# Patient Record
Sex: Female | Born: 1995 | Race: White | Hispanic: No | Marital: Single | Smoking: Never smoker
Health system: Southern US, Community
[De-identification: ages and names within clinical notes are randomized; demographics above are authoritative.]

## PROBLEM LIST (undated history)

## (undated) DIAGNOSIS — F419 Anxiety disorder, unspecified: Secondary | ICD-10-CM

## (undated) DIAGNOSIS — J353 Hypertrophy of tonsils with hypertrophy of adenoids: Secondary | ICD-10-CM

## (undated) DIAGNOSIS — Z87442 Personal history of urinary calculi: Secondary | ICD-10-CM

## (undated) HISTORY — PX: WISDOM TOOTH EXTRACTION: SHX21

## (undated) HISTORY — PX: EAR TUBE REMOVAL: SHX1486

## (undated) HISTORY — PX: TYMPANOSTOMY TUBE PLACEMENT: SHX32

---
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
## 2016-10-27 ENCOUNTER — Ambulatory Visit (INDEPENDENT_AMBULATORY_CARE_PROVIDER_SITE_OTHER): Payer: BC Managed Care – PPO | Admitting: Medical

## 2016-10-27 ENCOUNTER — Encounter: Payer: Self-pay | Admitting: Medical

## 2016-10-27 VITALS — BP 110/80 | HR 73 | Ht 66.0 in | Wt 129.8 lb

## 2016-10-27 DIAGNOSIS — M222X1 Patellofemoral disorders, right knee: Secondary | ICD-10-CM | POA: Diagnosis not present

## 2016-10-27 DIAGNOSIS — M25561 Pain in right knee: Secondary | ICD-10-CM | POA: Diagnosis not present

## 2016-10-27 DIAGNOSIS — Z23 Encounter for immunization: Secondary | ICD-10-CM

## 2016-10-27 NOTE — Addendum Note (Signed)
Addended by: Winn Jock on: 10/27/2016 03:45 PM   Modules accepted: Orders

## 2016-10-27 NOTE — Progress Notes (Signed)
Subjective: Chief Complaint  Patient presents with  . New Patient (Initial Visit)    new patient , right knee pain x3-4 days no injury   Here for knee pain.  New patient.  Accompanied by her mother Erica Guerrero who sees me.    Was seeing pediatrics prior.   She attends [employer] in [place], has used [employer].  Here for right knee pain.  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   Was walking back from gym the other day and it was causing pain.   Could barely walk on it for 2 days.  First noticed the pain walking back from the gym.  In the past had concerns about juvenile RA, saw rheumatologist at [facility].    Is very active.  Her room mate is a Product/process development scientist and she has been doing exercise with her.    Past Medical History:  Diagnosis Date  . Anxiety   . Kidney stones   . Kidney stones   . Kidney stones    Current Outpatient Prescriptions on File Prior to Visit  Medication Sig Dispense Refill  . ibuprofen (ADVIL,MOTRIN) 600 MG tablet Take 1 tablet (600 mg total) by mouth every 6 (six) hours as needed. 30 tablet 0  . LO LOESTRIN FE 1 MG-10 MCG / 10 MCG tablet Take 1 tablet by mouth daily.  0  . Melatonin 3 MG TABS Take 3 mg by mouth at bedtime.     . sertraline (ZOLOFT) 100 MG tablet Take 100 mg by mouth daily.  6   No current facility-administered medications on file prior to visit.    ROS as in subjective   Objective: BP 110/80   Pulse 73   Ht  (1.676 m)   Wt 129 lb 12.8 oz (58.9 kg)   SpO2 98%   BMI 20.95 kg/m   Gen: wd, wn, nad, lean white female Tender over right medial knee joint line, mild pain with leg extension and some crepitus felt in right knee.   Tender over inferior medial right patella, right patellar tendon and insertion of patellar tendon.  otherwise legs and knees nontneder, normal ROM of legs otherwise Legs neurovascularly intact No edema, no obvious knee swelling, no erythema or warmth No hip and ankle  tenderness, normal hip and ankle ROM    Assessment: Encounter Diagnoses  Name Primary?  . Right knee pain, unspecified chronicity Yes  . Patellofemoral disorder of right knee   . Need for influenza vaccination     Plan: Discussed findings, treatment recommendations.   Recommendations:  Use 1 legged squats and 1 legged leg presses with very little weight, doing 20-30 repetition  Several days per week  Avoid other leg strengthening exercise for now such as leg extensions, lunges, etc  Stretch daily  After exercise or when painful or swollen, use ice water bath or bag of frozen peas for 20 minutes at least twice daily  Use ice for the next week at least  Use Aleve 1-2 times daily OTC, or Ibuprofen up to 3 times daily for the next 1-2 weeks  Avoid deep knee bends  Consider wearing your exercise shoes all the time or consider arch insert for the shoes Begin taking Glucosamine Chondroitin daily  F/u 2-3 wk.  Erica Guerrero was seen today for new patient (initial visit).  Diagnoses and all orders for this visit:  Right knee pain, unspecified chronicity  Patellofemoral disorder of right knee  Need for influenza vaccination

## 2016-10-27 NOTE — Patient Instructions (Addendum)
Recommendations:  Use 1 legged squats and 1 legged leg presses with very little weight, doing 20-30 repetition  Several days per week  Avoid other leg strengthening exercise for now such as leg extensions, lunges, etc  Stretch daily  After exercise or when painful or swollen, use ice water bath or bag of frozen peas for 20 minutes at least twice daily  Use ice for the next week at least  Use Aleve 1-2 times daily OTC, or Ibuprofen up to 3 times daily for the next 1-2 weeks  Avoid deep knee bends  Consider wearing your exercise shoes all the time or consider arch insert for the shoes  Begin taking Glucosamine Chondroitin daily     Patellofemoral Pain Syndrome Patellofemoral pain syndrome is a condition that involves a softening or breakdown of the tissue (cartilage) on the underside of your kneecap (patella). This causes pain in the front of the knee. The condition is also called runner's knee or chondromalacia patella. Patellofemoral pain syndrome is most common in young adults who are active in sports. Your knee is the largest joint in your body. The patella covers the front of your knee and is attached to muscles above and below your knee. The underside of the patella is covered with a smooth type of cartilage (synovium). The smooth surface helps the patella glide easily when you move your knee. Patellofemoral pain syndrome causes swelling in the joint linings and bone surfaces in your knee. What are the causes? Patellofemoral pain syndrome can be caused by:  Overuse.  Poor alignment of your knee joints.  Weak leg muscles.  A direct blow to your kneecap.  What increases the risk? You may be at risk for patellofemoral pain syndrome if you:  Do a lot of activities that can wear down your kneecap. These include: ? Running. ? Squatting. ? Climbing stairs.  Start a new physical activity or exercise program.  Wear shoes that do not fit well.  Do not have good leg  strength.  Are overweight.  What are the signs or symptoms? Knee pain is the most common symptom of patellofemoral pain syndrome. This may feel like a dull, aching pain underneath your patella, in the front of your knee. There may be a popping or cracking sound when you move your knee. Pain may get worse with:  Exercise.  Climbing stairs.  Running.  Jumping.  Squatting.  Kneeling.  Sitting for a long time.  Moving or pushing on your patella.  How is this diagnosed? Your health care provider may be able to diagnose patellofemoral pain syndrome from your symptoms and medical history. You may be asked about your recent physical activities and which ones cause knee pain. Your health care provider may do a physical exam with certain tests to confirm the diagnosis. These may include:  Moving your patella back and forth.  Checking your range of knee motion.  Having you squat or jump to see if you have pain.  Checking the strength of your leg muscles.  An MRI of the knee may also be done. How is this treated? Patellofemoral pain syndrome can usually be treated at home with rest, ice, compression, and elevation (RICE). Other treatments may include:  Nonsteroidal anti-inflammatory drugs (NSAIDs).  Physical therapy to stretch and strengthen your leg muscles.  Shoe inserts (orthotics) to take stress off your knee.  A knee brace or knee support.  Surgery to remove damaged cartilage or move the patella to a better position. The need for  surgery is rare.  Follow these instructions at home:  Take medicines only as directed by your health care provider.  Rest your knee. ? When resting, keep your knee raised above the level of your heart. ? Avoid activities that cause knee pain.  Apply ice to the injured area: ? Put ice in a plastic bag. ? Place a towel between your skin and the bag. ? Leave the ice on for 20 minutes, 2-3 times a day.  Use splints, braces, knee supports, or  walking aids as directed by your health care provider.  Perform stretching and strengthening exercises as directed by your health care provider or physical therapist.  Keep all follow-up visits as directed by your health care provider. This is important. Contact a health care provider if:  Your symptoms get worse.  You are not improving with home care. This information is not intended to replace advice given to you by your health care provider. Make sure you discuss any questions you have with your health care provider. Document Released: 12/28/2008 Document Revised: 06/17/2015 Document Reviewed: 03/31/2013 Elsevier Interactive Patient Education  2018 [employer].

## 2017-11-06 ENCOUNTER — Ambulatory Visit: Payer: BC Managed Care – PPO | Admitting: Medical

## 2017-11-06 ENCOUNTER — Encounter: Payer: Self-pay | Admitting: Medical

## 2017-11-06 VITALS — BP 112/70 | HR 110 | Temp 98.3°F | Resp 16 | Ht 66.5 in | Wt 136.4 lb

## 2017-11-06 DIAGNOSIS — J020 Streptococcal pharyngitis: Secondary | ICD-10-CM | POA: Diagnosis not present

## 2017-11-06 DIAGNOSIS — J3501 Chronic tonsillitis: Secondary | ICD-10-CM | POA: Diagnosis not present

## 2017-11-06 DIAGNOSIS — J029 Acute pharyngitis, unspecified: Secondary | ICD-10-CM | POA: Diagnosis not present

## 2017-11-06 LAB — POCT RAPID STREP A (OFFICE): Rapid Strep A Screen: POSITIVE — AB

## 2017-11-06 MED ORDER — PENICILLIN G BENZATHINE 1200000 UNIT/2ML IM SUSP
1.2000 10*6.[IU] | Freq: Once | INTRAMUSCULAR | Status: AC
  Administered 2017-11-06: 1.2 10*6.[IU] via INTRAMUSCULAR

## 2017-11-06 MED ORDER — AMOXICILLIN 500 MG PO CAPS: 500.0000 mg | ORAL_CAPSULE | Freq: Three times a day (TID) | ORAL | 0 refills | Status: DC

## 2017-11-06 NOTE — Patient Instructions (Signed)
Thank you for giving me the opportunity to serve you today.   Your diagnosis today includes: Encounter Diagnoses  Name Primary?  . Sore throat Yes  . Strep pharyngitis   . Chronic tonsillitis     Specific home care recommendations today include:  Make sure you complete the course of antibiotics.    You are contagious until you have been on the antibiotics for 24 hours or more, or until you have no fever, whichever is longer.    Only take over-the-counter (OTC) or prescription medicines for pain, discomfort, or fever as directed by your caregiver.    Sore throat remedies:  You may use salt water gargles, warm fluids such as coffee or hot tea, or honey/tea/lemon mixture to sooth sore throat pain.  You may use OTC sore throat remedies such as Cepacol lozenges or Chloraseptic spray for sore throat pain.  Pain/fever relief: You may use over-the-counter Tylenol for pain or fever  Drink extra fluids. Fluids help thin the mucus so your sinuses can drain more easily.   Please call or return if worse or not improving in the next few days.      I have included other useful information below for your review.   Strep Throat Strep throat is an infection of the throat. It is caused by a germ. Strep throat spreads from person to person by coughing, sneezing, or close contact. HOME CARE  Rinse your mouth (gargle) with warm salt water (1 teaspoon salt in 1 cup of water). Do this 3 to 4 times per day or as needed for comfort.   Family members with a sore throat or fever should see a doctor.   Make sure everyone in your house washes their hands well.   Do not share food, drinking cups, or personal items.   Eat soft foods until your sore throat gets better.   Drink enough water and fluids to keep your pee (urine) clear or pale yellow.   Rest.   Stay home from school, daycare, or work until you have taken medicine for 24 hours.   Only take medicine as told by your doctor.   Take your  medicine as told. Finish it even if you start to feel better.  GET HELP RIGHT AWAY IF:   You have new problems, such as throwing up (vomiting) or bad headaches.   You have a stiff or painful neck, chest pain, trouble breathing, or trouble swallowing.   You have very bad throat pain, drooling, or changes in your voice.   Your neck puffs up (swells) or gets red and tender.   You have a fever.   You are very tired, your mouth is dry, or you are peeing less than normal.   You cannot wake up completely.   You get a rash, cough, or earache.   You have green, yellow-brown, or bloody spit.   Your pain does not get better with medicine.  MAKE SURE YOU:   Understand these instructions.   Will watch your condition.   Will get help right away if you are not doing well or get worse.  Document Released: 06/28/2007 Document Revised: 09/21/2010 Document Reviewed: 03/10/2010 [facility] Patient Information 2012 [place], [place].

## 2017-11-06 NOTE — Progress Notes (Signed)
  Subjective: Erica Guerrero is a 22 y.o. female who presents for evaluation of sore throat.  Here today with her mother. She has a 1 day history of abrupt onset of sore throat, swollen nodes, ear discomfort, achy, occasional cough.  No nausea or vomiting.  No sick contacts.  Every year in the fall seems to get strep throat.  Is actually had strep throat 3 times this year already.  Mom says she has a long history of strep throat infections and big tonsils.  Wants her tonsils out.  she wants referral to ENT. No other aggravating or relieving factors.  No other c/o.  The following portions of the patient's history were reviewed and updated as appropriate: allergies, current medications, past medical history, past social history, past surgical history and problem list.  Past Medical History:  Diagnosis Date  . Anxiety   . Kidney stones   . Kidney stones   . Kidney stones     ROS as in subjective    Objective: BP 112/70   Pulse (!) 110   Temp 98.3 F (36.8 C) (Oral)   Resp 16   Ht 5' 6.5" (1.689 m)   Wt 136 lb 6.4 oz (61.9 kg)   SpO2 97%   BMI 21.69 kg/m   General appearance: no distress, WD/WN, *ill-appearing HEENT: normocephalic, conjunctiva/corneas normal, sclerae anicteric, nares patent, no discharge or erythema, pharynx with moderate erythema, faint white exudate, 2+ tonsils bilaterally.  Oral cavity: MMM, no lesions  Neck: supple, shotty anterior tender nodes, no thyromegaly Heart: RRR, normal S1, S2, no murmurs Lungs: CTA bilaterally, no wheezes, rhonchi, or rales Abdomen: +bs, soft, non tender, non distended, no masses, no hepatomegaly, no splenomegaly    Assessment: Encounter Diagnoses  Name Primary?  . Sore throat Yes  . Strep pharyngitis   . Chronic tonsillitis      Plan: Advised that sore throat etiology appears to be bacterial.    Gave Bicillin LA 1.2 million units IM x 1.  She preferred this instead of oral medication  Discussed symptoms, diagnosis, and  possible complications including peritonsillar abscess formation.  Advised that they will be infectious for 24 hours after starting antibiotics.  Discussed means of prevention, precautions.  Supportive care recommended including OTC analgesics, salt water gargles, warm fluids, good hydration, and rest.  Discussed signs or symptoms that would prompt immediate evaluation.   Call or return if worse or not improving in the next 2-3 days.  Patient voiced understanding of diagnosis, recommendations, and treatment plan.  After visit summary given.   Erica Guerrero was seen today for sore throat.  Diagnoses and all orders for this visit:  Sore throat -     Rapid Strep A  Strep pharyngitis -     Ambulatory referral to ENT -     penicillin g benzathine (BICILLIN LA) 1200000 UNIT/2ML injection 1.2 Million Units  Chronic tonsillitis -     Ambulatory referral to ENT  Other orders -     Discontinue: amoxicillin (AMOXIL) 500 MG capsule; Take 1 capsule (500 mg total) by mouth 3 (three) times daily. -     Discontinue: amoxicillin (AMOXIL) 500 MG capsule; Take 1 capsule (500 mg total) by mouth 3 (three) times daily.

## 2017-12-23 DIAGNOSIS — J353 Hypertrophy of tonsils with hypertrophy of adenoids: Secondary | ICD-10-CM

## 2017-12-23 HISTORY — DX: Hypertrophy of tonsils with hypertrophy of adenoids: J35.3

## 2017-12-25 ENCOUNTER — Other Ambulatory Visit: Payer: Self-pay | Admitting: Otolaryngology

## 2017-12-25 ENCOUNTER — Encounter (HOSPITAL_BASED_OUTPATIENT_CLINIC_OR_DEPARTMENT_OTHER): Payer: Self-pay | Admitting: *Deleted

## 2017-12-25 ENCOUNTER — Other Ambulatory Visit: Payer: Self-pay

## 2017-12-31 ENCOUNTER — Ambulatory Visit (HOSPITAL_BASED_OUTPATIENT_CLINIC_OR_DEPARTMENT_OTHER)
Admission: RE | Admit: 2017-12-31 | Discharge: 2017-12-31 | Disposition: A | Discharge status: Home / Self Care | Payer: BC Managed Care – PPO | Source: Ambulatory Visit | Attending: Otolaryngology | Admitting: Otolaryngology

## 2017-12-31 ENCOUNTER — Other Ambulatory Visit: Payer: Self-pay

## 2017-12-31 ENCOUNTER — Encounter (HOSPITAL_BASED_OUTPATIENT_CLINIC_OR_DEPARTMENT_OTHER): Payer: Self-pay

## 2017-12-31 ENCOUNTER — Ambulatory Visit (HOSPITAL_BASED_OUTPATIENT_CLINIC_OR_DEPARTMENT_OTHER): Payer: BC Managed Care – PPO | Admitting: Certified Registered"

## 2017-12-31 ENCOUNTER — Encounter (HOSPITAL_BASED_OUTPATIENT_CLINIC_OR_DEPARTMENT_OTHER)
Admission: RE | Disposition: A | Discharge status: Home / Self Care | Payer: Self-pay | Source: Ambulatory Visit | Attending: Otolaryngology

## 2017-12-31 DIAGNOSIS — J3501 Chronic tonsillitis: Secondary | ICD-10-CM | POA: Diagnosis not present

## 2017-12-31 DIAGNOSIS — F419 Anxiety disorder, unspecified: Secondary | ICD-10-CM | POA: Insufficient documentation

## 2017-12-31 DIAGNOSIS — J029 Acute pharyngitis, unspecified: Secondary | ICD-10-CM | POA: Insufficient documentation

## 2017-12-31 DIAGNOSIS — J353 Hypertrophy of tonsils with hypertrophy of adenoids: Secondary | ICD-10-CM | POA: Diagnosis present

## 2017-12-31 HISTORY — DX: Personal history of urinary calculi: Z87.442

## 2017-12-31 HISTORY — PX: TONSILLECTOMY AND ADENOIDECTOMY: SHX28

## 2017-12-31 HISTORY — DX: Hypertrophy of tonsils with hypertrophy of adenoids: J35.3

## 2017-12-31 LAB — POCT PREGNANCY, URINE: Preg Test, Ur: NEGATIVE

## 2017-12-31 SURGERY — TONSILLECTOMY AND ADENOIDECTOMY
Anesthesia: General | Site: Throat | Laterality: Bilateral

## 2017-12-31 MED ORDER — LACTATED RINGERS IV SOLN
INTRAVENOUS | Status: DC
  Administered 2017-12-31: 07:00:00 via INTRAVENOUS

## 2017-12-31 MED ORDER — LIDOCAINE 2% (20 MG/ML) 5 ML SYRINGE
INTRAMUSCULAR | Status: DC | PRN
  Administered 2017-12-31: 60 mg via INTRAVENOUS

## 2017-12-31 MED ORDER — MIDAZOLAM HCL 2 MG/2ML IJ SOLN
1.0000 mg | INTRAMUSCULAR | Status: DC | PRN
  Administered 2017-12-31: 2 mg via INTRAVENOUS

## 2017-12-31 MED ORDER — FENTANYL CITRATE (PF) 100 MCG/2ML IJ SOLN
25.0000 ug | INTRAMUSCULAR | Status: DC | PRN
  Administered 2017-12-31 (×2): 25 ug via INTRAVENOUS

## 2017-12-31 MED ORDER — PROPOFOL 10 MG/ML IV BOLUS
INTRAVENOUS | Status: DC | PRN
  Administered 2017-12-31: 200 mg via INTRAVENOUS

## 2017-12-31 MED ORDER — FENTANYL CITRATE (PF) 100 MCG/2ML IJ SOLN
INTRAMUSCULAR | Status: AC
  Filled 2017-12-31: qty 2

## 2017-12-31 MED ORDER — ONDANSETRON HCL 4 MG/2ML IJ SOLN
INTRAMUSCULAR | Status: DC | PRN
  Administered 2017-12-31: 4 mg via INTRAVENOUS

## 2017-12-31 MED ORDER — DEXAMETHASONE SODIUM PHOSPHATE 4 MG/ML IJ SOLN
INTRAMUSCULAR | Status: DC | PRN
  Administered 2017-12-31: 10 mg via INTRAVENOUS

## 2017-12-31 MED ORDER — AMOXICILLIN 400 MG/5ML PO SUSR: 800.0000 mg | Freq: Two times a day (BID) | ORAL | 0 refills | Status: AC

## 2017-12-31 MED ORDER — OXYCODONE HCL 5 MG/5ML PO SOLN: 5.0000 mg | ORAL | 0 refills | Status: AC | PRN

## 2017-12-31 MED ORDER — OXYMETAZOLINE HCL 0.05 % NA SOLN
NASAL | Status: DC | PRN
  Administered 2017-12-31: 1 via TOPICAL

## 2017-12-31 MED ORDER — SCOPOLAMINE 1 MG/3DAYS TD PT72: 1.0000 | MEDICATED_PATCH | Freq: Once | TRANSDERMAL | Status: DC | PRN

## 2017-12-31 MED ORDER — ONDANSETRON HCL 4 MG/2ML IJ SOLN: 4.0000 mg | Freq: Once | INTRAMUSCULAR | Status: DC | PRN

## 2017-12-31 MED ORDER — OXYCODONE HCL 5 MG/5ML PO SOLN
5.0000 mg | Freq: Once | ORAL | Status: AC | PRN
  Administered 2017-12-31: 5 mg via ORAL

## 2017-12-31 MED ORDER — OXYCODONE HCL 5 MG PO TABS: 5.0000 mg | ORAL_TABLET | Freq: Once | ORAL | Status: AC | PRN

## 2017-12-31 MED ORDER — FENTANYL CITRATE (PF) 100 MCG/2ML IJ SOLN
50.0000 ug | INTRAMUSCULAR | Status: DC | PRN
  Administered 2017-12-31: 100 ug via INTRAVENOUS

## 2017-12-31 MED ORDER — MIDAZOLAM HCL 2 MG/2ML IJ SOLN
INTRAMUSCULAR | Status: AC
  Filled 2017-12-31: qty 2

## 2017-12-31 MED ORDER — SODIUM CHLORIDE 0.9 % IR SOLN
Status: DC | PRN
  Administered 2017-12-31: 100 mL

## 2017-12-31 MED ORDER — SUCCINYLCHOLINE CHLORIDE 20 MG/ML IJ SOLN
INTRAMUSCULAR | Status: DC | PRN
  Administered 2017-12-31: 100 mg via INTRAVENOUS

## 2017-12-31 MED ORDER — OXYCODONE HCL 5 MG/5ML PO SOLN
ORAL | Status: AC
  Filled 2017-12-31: qty 5

## 2017-12-31 SURGICAL SUPPLY — 31 items
BANDAGE COBAN STERILE 2 (GAUZE/BANDAGES/DRESSINGS) IMPLANT
CANISTER SUCT 1200ML W/VALVE (MISCELLANEOUS) ×2 IMPLANT
CATH ROBINSON RED A/P 10FR (CATHETERS) IMPLANT
CATH ROBINSON RED A/P 14FR (CATHETERS) ×2 IMPLANT
COAGULATOR SUCT SWTCH 10FR 6 (ELECTROSURGICAL) IMPLANT
COVER BACK TABLE 60X90IN (DRAPES) ×2 IMPLANT
COVER MAYO STAND STRL (DRAPES) ×2 IMPLANT
COVER WAND RF STERILE (DRAPES) IMPLANT
ELECT REM PT RETURN 9FT ADLT (ELECTROSURGICAL) ×2
ELECT REM PT RETURN 9FT PED (ELECTROSURGICAL)
ELECTRODE REM PT RETRN 9FT PED (ELECTROSURGICAL) IMPLANT
ELECTRODE REM PT RTRN 9FT ADLT (ELECTROSURGICAL) ×1 IMPLANT
GAUZE SPONGE 4X4 12PLY STRL LF (GAUZE/BANDAGES/DRESSINGS) ×2 IMPLANT
GLOVE BIO SURGEON STRL SZ 6.5 (GLOVE) ×2 IMPLANT
GLOVE BIO SURGEON STRL SZ7.5 (GLOVE) ×2 IMPLANT
GOWN STRL REUS W/ TWL LRG LVL3 (GOWN DISPOSABLE) ×2 IMPLANT
GOWN STRL REUS W/TWL LRG LVL3 (GOWN DISPOSABLE) ×2
IV NS 500ML (IV SOLUTION) ×1
IV NS 500ML BAXH (IV SOLUTION) ×1 IMPLANT
MARKER SKIN DUAL TIP RULER LAB (MISCELLANEOUS) IMPLANT
NS IRRIG 1000ML POUR BTL (IV SOLUTION) ×2 IMPLANT
SHEET MEDIUM DRAPE 40X70 STRL (DRAPES) ×2 IMPLANT
SOLUTION BUTLER CLEAR DIP (MISCELLANEOUS) ×2 IMPLANT
SPONGE TONSIL TAPE 1 RFD (DISPOSABLE) IMPLANT
SPONGE TONSIL TAPE 1.25 RFD (DISPOSABLE) ×2 IMPLANT
SYR BULB 3OZ (MISCELLANEOUS) IMPLANT
TOWEL GREEN STERILE FF (TOWEL DISPOSABLE) ×2 IMPLANT
TUBE CONNECTING 20X1/4 (TUBING) ×2 IMPLANT
TUBE SALEM SUMP 12R W/ARV (TUBING) IMPLANT
TUBE SALEM SUMP 16 FR W/ARV (TUBING) ×2 IMPLANT
WAND COBLATOR 70 EVAC XTRA (SURGICAL WAND) ×2 IMPLANT

## 2017-12-31 NOTE — Op Note (Signed)
DATE OF PROCEDURE:  12/31/2017                              OPERATIVE REPORT  SURGEON:  Newman PiesSu Jashan Cotten, MD  PREOPERATIVE DIAGNOSES: 1. Adenotonsillar hypertrophy. 2. Chronic tonsillitis and pharyngitis  POSTOPERATIVE DIAGNOSES: 1. Adenotonsillar hypertrophy. 2. Chronic tonsillitis and pharyngitis  PROCEDURE PERFORMED:  Adenotonsillectomy.  ANESTHESIA:  General endotracheal tube anesthesia.  COMPLICATIONS:  None.  ESTIMATED BLOOD LOSS:  Minimal.  INDICATION FOR PROCEDURE:  Erica Guerrero is a 22 y.o. female with a history of chronic tonsillitis/pharyngitis.  According to the patient, she has been experiencing chronic throat discomfort with halitosis for several years. The patient continues to be symptomatic despite medical treatments. On examination, the patient was noted to have bilateral cryptic tonsils. Based on the above findings, the decision was made for the patient to undergo the adenotonsillectomy procedure. Likelihood of success in reducing symptoms was also discussed.  The risks, benefits, alternatives, and details of the procedure were discussed with the patient.  Questions were invited and answered.  Informed consent was obtained.  DESCRIPTION:  The patient was taken to the operating room and placed supine on the operating table.  General endotracheal tube anesthesia was administered by the anesthesiologist.  The patient was positioned and prepped and draped in a standard fashion for adenotonsillectomy.  A Crowe-Davis mouth gag was inserted into the oral cavity for exposure. 3+ cryptic tonsils were noted bilaterally.  No bifidity was noted.  Indirect mirror examination of the nasopharynx revealed mild adenoid hypertrophy. The adenoid was ablated with the Coblator device. Hemostasis was achieved with the Coblator device.  The right tonsil was then grasped with a straight Allis clamp and retracted medially.  It was resected free from the underlying pharyngeal constrictor muscles with the  Coblator device.  The same procedure was repeated on the left side without exception.  The surgical sites were copiously irrigated.  The mouth gag was removed.  The care of the patient was turned over to the anesthesiologist.  The patient was awakened from anesthesia without difficulty.  The patient was extubated and transferred to the recovery room in good condition.  OPERATIVE FINDINGS:  Adenotonsillar hypertrophy.  SPECIMEN:  None.  FOLLOWUP CARE:  The patient will be discharged home once awake and alert.  She will be placed on amoxicillin 800 mg p.o. b.i.d. for 5 days, and oxycodone 5-3910ml po q 4 hours for postop pain control.   The patient will follow up in my office in approximately 2 weeks.  Babette Stum W Daray Polgar 12/31/2017 8:42 AM

## 2017-12-31 NOTE — Discharge Instructions (Addendum)

## 2017-12-31 NOTE — H&P (Signed)
Cc: Chronic tonsillitis  HPI: The patient is a 22 y/o female who presents today with her mother. The patient is seen in consultation requested by Kristian CoveyShane Tysinger, PA-C. According to the mother, the patient has been experiencing recurrent tonsillitis most of her life. She has been treated several times in the past 3 months. No significant snoring is noted. The patient is otherwise healthy. The patient had tubes when she was younger.   The patient's review of systems (constitutional, eyes, ENT, cardiovascular, respiratory, GI, musculoskeletal, skin, neurologic, psychiatric, endocrine, hematologic, allergic) is noted in the ROS questionnaire.  It is reviewed with the patient and her mother.   Family health history: No HTN, DM, CAD, hearing loss or bleeding disorder.   Major events: Wisdom teeth extraction, bilateral myringotomy with tubes.   Ongoing medical problems: Anxiety disorder.   Social history: The patient is single. She denies the use of tobacco or illegal drugs. She drinks alcohol 2-3 times a week.   Exam: General: Communicates without difficulty, well nourished, no acute distress. Head:  Normocephalic, no lesions or asymmetry. Eyes: PERRL, EOMI. No scleral icterus, conjunctivae clear.  Neuro: CN II exam reveals vision grossly intact.  No nystagmus at any point of gaze. Ears:  EAC normal without erythema AU.  TM intact without fluid and mobile AU. Nose: Moist, pink mucosa without lesions or mass. Mouth: Oral cavity clear and moist, no lesions, tonsils symmetric. Tonsils are 3+. Tonsils with mild erythema. Neck: Full range of motion, no lymphadenopathy or masses.   Assessment  1.  The patient's history and physical exam findings are consistent with chronic tonsillitis/pharyngitis secondary to adenotonsillar hypertrophy.  Plan: 1. The treatment options include continuing conservative observation versus adenotonsillectomy.  Based on the patient's history and physical exam findings, the  patient will likely benefit from having the tonsils and adenoid removed.  The risks, benefits, alternatives, and details of the procedure are reviewed with the patient and the parent.  Questions are invited and answered.  2. The patient is interested in proceeding with the procedure.  We will schedule the procedure in accordance with the family schedule.

## 2017-12-31 NOTE — Anesthesia Preprocedure Evaluation (Addendum)
Anesthesia Evaluation  Patient identified by MRN, date of birth, ID band Patient awake    Reviewed: Allergy & Precautions, NPO status , Patient's Chart, lab work & pertinent test results  History of Anesthesia Complications Negative for: history of anesthetic complications  Airway Mallampati: I  TM Distance: >3 FB Neck ROM: Full    Dental no notable dental hx.    Pulmonary neg pulmonary ROS,    Pulmonary exam normal        Cardiovascular negative cardio ROS Normal cardiovascular exam     Neuro/Psych negative neurological ROS  negative psych ROS   GI/Hepatic negative GI ROS, Neg liver ROS,   Endo/Other  negative endocrine ROS  Renal/GU negative Renal ROS  negative genitourinary   Musculoskeletal negative musculoskeletal ROS (+)   Abdominal   Peds  Hematology negative hematology ROS (+)   Anesthesia Other Findings Healthy 22 yo for T&A  Reproductive/Obstetrics negative OB ROS                            Anesthesia Physical Anesthesia Plan  ASA: I  Anesthesia Plan: General   Post-op Pain Management:    Induction: Intravenous  PONV Risk Score and Plan: 3 and Ondansetron, Dexamethasone, Midazolam and Treatment may vary due to age or medical condition  Airway Management Planned: Oral ETT  Additional Equipment: None  Intra-op Plan:   Post-operative Plan: Extubation in OR  Informed Consent: I have reviewed the patients History and Physical, chart, labs and discussed the procedure including the risks, benefits and alternatives for the proposed anesthesia with the patient or authorized representative who has indicated his/her understanding and acceptance.     Plan Discussed with:   Anesthesia Plan Comments:        Anesthesia Quick Evaluation

## 2017-12-31 NOTE — Anesthesia Procedure Notes (Signed)
Procedure Name: Intubation Date/Time: 12/31/2017 8:06 AM Performed by: Caren Macadamarter, Tahjai Schetter W, CRNA Pre-anesthesia Checklist: Patient identified, Emergency Drugs available, Suction available and Patient being monitored Patient Re-evaluated:Patient Re-evaluated prior to induction Oxygen Delivery Method: Circle system utilized Preoxygenation: Pre-oxygenation with 100% oxygen Induction Type: IV induction Ventilation: Mask ventilation without difficulty Laryngoscope Size: Miller and 2 Grade View: Grade I Tube type: Oral Tube size: 7.0 mm Number of attempts: 1 Airway Equipment and Method: Stylet and Oral airway Placement Confirmation: ETT inserted through vocal cords under direct vision,  positive ETCO2 and breath sounds checked- equal and bilateral Secured at: 22 cm Tube secured with: Tape Dental Injury: Teeth and Oropharynx as per pre-operative assessment

## 2017-12-31 NOTE — Transfer of Care (Signed)
Immediate Anesthesia Transfer of Care Note  Patient: Erica Guerrero  Procedure(s) Performed: TONSILLECTOMY AND ADENOIDECTOMY (Bilateral Throat)  Patient Location: PACU  Anesthesia Type:General  Level of Consciousness: awake, alert  and oriented  Airway & Oxygen Therapy: Patient Spontanous Breathing and Patient connected to face mask oxygen  Post-op Assessment: Report given to RN and Post -op Vital signs reviewed and stable  Post vital signs: Reviewed and stable  Last Vitals:  Vitals Value Taken Time  BP    Temp    Pulse    Resp    SpO2      Last Pain:  Vitals:   12/31/17 0657  TempSrc: Oral  PainSc: 0-No pain         Complications: No apparent anesthesia complications

## 2017-12-31 NOTE — Anesthesia Postprocedure Evaluation (Signed)
Anesthesia Post Note  Patient: Jeniah E Shenefield  Procedure(s) Performed: TONSILLECTOMY AND ADENOIDECTOMY (Bilateral Throat)     Patient location during evaluation: PACU Anesthesia Type: General Level of consciousness: awake and alert Pain management: pain level controlled Vital Signs Assessment: post-procedure vital signs reviewed and stable Respiratory status: spontaneous breathing, nonlabored ventilation, respiratory function stable and patient connected to nasal cannula oxygen Cardiovascular status: blood pressure returned to baseline and stable Postop Assessment: no apparent nausea or vomiting Anesthetic complications: no    Last Vitals:  Vitals:   12/31/17 0845 12/31/17 0900  BP: 113/80 118/85  Pulse: (!) 105 89  Resp: 13 20  Temp:    SpO2: 100% 98%    Last Pain:  Vitals:   12/31/17 0900  TempSrc:   PainSc: 5                  Lucretia Kernarolyn E Rumi Taras

## 2018-01-01 ENCOUNTER — Encounter (HOSPITAL_BASED_OUTPATIENT_CLINIC_OR_DEPARTMENT_OTHER): Payer: Self-pay | Admitting: Otolaryngology

## 2020-02-27 ENCOUNTER — Encounter: Payer: Self-pay | Admitting: Medical

## 2020-02-27 ENCOUNTER — Other Ambulatory Visit: Payer: Self-pay

## 2020-02-27 ENCOUNTER — Ambulatory Visit: Payer: BC Managed Care – PPO | Admitting: Medical

## 2020-02-27 ENCOUNTER — Telehealth: Payer: Self-pay | Admitting: Medical

## 2020-02-27 VITALS — BP 102/70 | HR 76 | Temp 99.9°F | Ht 67.0 in | Wt 152.4 lb

## 2020-02-27 DIAGNOSIS — R1030 Lower abdominal pain, unspecified: Secondary | ICD-10-CM | POA: Diagnosis not present

## 2020-02-27 DIAGNOSIS — Z87442 Personal history of urinary calculi: Secondary | ICD-10-CM

## 2020-02-27 DIAGNOSIS — M549 Dorsalgia, unspecified: Secondary | ICD-10-CM | POA: Diagnosis not present

## 2020-02-27 DIAGNOSIS — R350 Frequency of micturition: Secondary | ICD-10-CM | POA: Diagnosis not present

## 2020-02-27 DIAGNOSIS — R110 Nausea: Secondary | ICD-10-CM

## 2020-02-27 LAB — CBC WITH DIFFERENTIAL/PLATELET
Basophils Absolute: 0.1 10*3/uL (ref 0.0–0.2)
Basos: 1 %
EOS (ABSOLUTE): 0.2 10*3/uL (ref 0.0–0.4)
Eos: 2 %
Hematocrit: 38.7 % (ref 34.0–46.6)
Hemoglobin: 13 g/dL (ref 11.1–15.9)
Immature Grans (Abs): 0 10*3/uL (ref 0.0–0.1)
Immature Granulocytes: 0 %
Lymphocytes Absolute: 3.5 10*3/uL — ABNORMAL HIGH (ref 0.7–3.1)
Lymphs: 44 %
MCH: 29.4 pg (ref 26.6–33.0)
MCHC: 33.6 g/dL (ref 31.5–35.7)
MCV: 88 fL (ref 79–97)
Monocytes Absolute: 0.5 10*3/uL (ref 0.1–0.9)
Monocytes: 6 %
Neutrophils Absolute: 3.7 10*3/uL (ref 1.4–7.0)
Neutrophils: 47 %
Platelets: 305 10*3/uL (ref 150–450)
RBC: 4.42 x10E6/uL (ref 3.77–5.28)
RDW: 12.2 % (ref 11.7–15.4)
WBC: 8 10*3/uL (ref 3.4–10.8)

## 2020-02-27 LAB — BASIC METABOLIC PANEL
BUN/Creatinine Ratio: 17 (ref 9–23)
BUN: 13 mg/dL (ref 6–20)
CO2: 24 mmol/L (ref 20–29)
Calcium: 9.9 mg/dL (ref 8.7–10.2)
Chloride: 103 mmol/L (ref 96–106)
Creatinine, Ser: 0.78 mg/dL (ref 0.57–1.00)
GFR calc Af Amer: 124 mL/min/{1.73_m2} (ref >59)
GFR calc non Af Amer: 107 mL/min/{1.73_m2} (ref >59)
Glucose: 101 mg/dL — ABNORMAL HIGH (ref 65–99)
Potassium: 4.9 mmol/L (ref 3.5–5.2)
Sodium: 138 mmol/L (ref 134–144)

## 2020-02-27 LAB — POCT URINALYSIS DIP (PROADVANTAGE DEVICE)
Bilirubin, UA: NEGATIVE
Blood, UA: NEGATIVE
Glucose, UA: NEGATIVE mg/dL
Ketones, POC UA: NEGATIVE mg/dL
Leukocytes, UA: NEGATIVE
Nitrite, UA: NEGATIVE
Protein Ur, POC: NEGATIVE mg/dL
Specific Gravity, Urine: 1.015
Urobilinogen, Ur: 0.2
pH, UA: 6 (ref 5.0–8.0)

## 2020-02-27 LAB — POCT URINE PREGNANCY: Preg Test, Ur: NEGATIVE

## 2020-02-27 MED ORDER — SULFAMETHOXAZOLE-TRIMETHOPRIM 800-160 MG PO TABS: 1.0000 | ORAL_TABLET | Freq: Two times a day (BID) | ORAL | 0 refills | Status: AC

## 2020-02-27 MED ORDER — ONDANSETRON HCL 4 MG PO TABS: 4.0000 mg | ORAL_TABLET | Freq: Three times a day (TID) | ORAL | 0 refills | Status: AC | PRN

## 2020-02-27 NOTE — Telephone Encounter (Signed)
Pt called back and wanted to see if she can get a drs note for work. She was out yesterday and will be out today because she said she is still in pain. Let me know if its ok to send note.

## 2020-02-27 NOTE — Telephone Encounter (Signed)
Yes that's ok

## 2020-02-27 NOTE — Progress Notes (Signed)
Subjective:   Erica Guerrero is a 25 y.o. female here today with boyfriend x last year.   She complains of possible urinary tract infection.  Started yesterday, including burning with urination, lower belly pain.  Having increased urination, urgency.   Has some blood but just finished period.  Urine has odor.  Feels some back pain.  No fever.  Has had some nausea, but no vomiting, no diarrhea.    LMP 02/23/20.   Periods are usually regular, but this one was a little early.    Last UTI in fall. No frequent UTIs.    Has hx/o kidney stones 7 times prior.  No prior surgical procedure as they have all passed on their own.  No prior hospitalization for urinary infection but has had hospitalization for kidney stone.    No vaginal discharge.    Sexually active, not on birth control.  No condom use.    Periods regular.    No other aggravating or relieving factors.  No other c/o.  Past Medical History:  Diagnosis Date  . Anxiety   . History of kidney stones   . Tonsillar and adenoid hypertrophy 12/2017    Current Outpatient Medications on File Prior to Visit  Medication Sig Dispense Refill  . oxyCODONE (ROXICODONE) 5 MG/5ML solution Take 5-10 mLs (5-10 mg total) by mouth every 4 (four) hours as needed for severe pain. (Patient not taking: Reported on 02/27/2020) 300 mL 0  . sertraline (ZOLOFT) 100 MG tablet Take 100 mg by mouth daily. (Patient not taking: Reported on 02/27/2020)  6   No current facility-administered medications on file prior to visit.    ROS as in subjective  Reviewed allergies, medications, past medical, surgical, and social history.     Objective: BP 102/70   Pulse 76   Temp 99.9 F (37.7 C)   Ht 5\' 7"  (1.702 m)   Wt 152 lb 6.4 oz (69.1 kg)   SpO2 96%   BMI 23.87 kg/m   General appearance: alert, no distress, WD/WN, female Abdomen: +bs, soft, moderate suprapubic tenderness, otherwise nontender, non distended, no masses, no hepatomegaly, no splenomegaly, no  bruits Back: + CVA tenderness GU: deferred      Assessment: Encounter Diagnoses  Name Primary?  . Frequent urination Yes  . Lower abdominal pain   . Costovertebral angle tenderness   . Nausea   . History of kidney stones      Plan: Discussed symptoms, possible UTI diagnosis vs other.  She has hx/o stones, normal UA today.    Labs as below.  Strain urine in the event of possible stones  Go to the emergency dept if fever, uncontrollable nausea and vomiting, worse pain.  She declines STD screen today  Of note, she reports mother has bladder issues, so consider Urology consult going forward.  She saw urologist once in past with prior stones.    Erica Guerrero was seen today for urinary tract infection.  Diagnoses and all orders for this visit:  Frequent urination -     POCT Urinalysis DIP (Proadvantage Device) -     Urine Culture -     POCT urine pregnancy -     Basic metabolic panel -     CBC with Differential/Platelet  Lower abdominal pain -     Urine Culture -     POCT urine pregnancy -     Basic metabolic panel -     CBC with Differential/Platelet  Costovertebral angle tenderness -  Urine Culture -     POCT urine pregnancy -     Basic metabolic panel -     CBC with Differential/Platelet  Nausea -     Urine Culture -     POCT urine pregnancy -     Basic metabolic panel -     CBC with Differential/Platelet  History of kidney stones -     Urine Culture -     POCT urine pregnancy -     Basic metabolic panel -     CBC with Differential/Platelet  Other orders -     sulfamethoxazole-trimethoprim (BACTRIM DS) 800-160 MG tablet; Take 1 tablet by mouth 2 (two) times daily. -     ondansetron (ZOFRAN) 4 MG tablet; Take 1 tablet (4 mg total) by mouth every 8 (eight) hours as needed for nausea or vomiting.     Call or return if worse or not improving in the next 3-4 days.

## 2020-02-29 LAB — URINE CULTURE

## 2020-03-03 LAB — OB RESULTS CONSOLE GC/CHLAMYDIA: Chlamydia: NEGATIVE

## 2020-05-04 ENCOUNTER — Other Ambulatory Visit: Payer: Self-pay

## 2020-05-04 ENCOUNTER — Telehealth: Payer: BC Managed Care – PPO | Admitting: Family Medicine

## 2020-05-04 ENCOUNTER — Encounter: Payer: Self-pay | Admitting: Family Medicine

## 2020-05-04 VITALS — Temp 99.0°F | Wt 152.0 lb

## 2020-05-04 DIAGNOSIS — J3089 Other allergic rhinitis: Secondary | ICD-10-CM | POA: Diagnosis not present

## 2020-05-04 DIAGNOSIS — R059 Cough, unspecified: Secondary | ICD-10-CM

## 2020-05-04 DIAGNOSIS — J029 Acute pharyngitis, unspecified: Secondary | ICD-10-CM | POA: Diagnosis not present

## 2020-05-04 DIAGNOSIS — Z8616 Personal history of COVID-19: Secondary | ICD-10-CM

## 2020-05-04 DIAGNOSIS — J309 Allergic rhinitis, unspecified: Secondary | ICD-10-CM | POA: Insufficient documentation

## 2020-05-04 MED ORDER — HYDROCOD POLST-CPM POLST ER 10-8 MG/5ML PO SUER: 5.0000 mL | Freq: Two times a day (BID) | ORAL | 0 refills | Status: AC | PRN

## 2020-05-04 NOTE — Progress Notes (Signed)
   Subjective:    Patient ID: Erica Guerrero, female    DOB: 13-Jul-1995, 25 y.o.   MRN: [id number]  HPI Documentation for virtual audio and video telecommunications through Caregility encounter: The patient was located at home. 2 patient identifiers used.  The provider was located in the office. The patient did consent to this visit and is aware of possible charges through their insurance for this visit. The other persons participating in this telemedicine service were none. Time spent on call was 5 minutes and in review of previous records >20 minutes total for counseling and coordination of care. This virtual service is not related to other E/M service within previous 7 days. She states that Saturday she developed slight cough, sore throat with rhinorrhea as well as nasal congestion.  She then had difficulty with malaise and occasional sneezing.  She does have underlying allergies mainly to cats and does take meds on a regular basis.  She has had 2 Covid shots but also had Covid in February of this year. She is using Mucinex DM and ibuprofen. Review of Systems     Objective:   Physical Exam  Alert and in no distress otherwise not examined      Assessment & Plan:  Cough - Plan: chlorpheniramine-HYDROcodone (TUSSIONEX PENNKINETIC ER) 10-8 MG/5ML SUER  History of COVID-19  Non-seasonal allergic rhinitis due to other allergic trigger  Sore throat Recommend she continue on Mucinex DM as well as 3 ibuprofen 4 times per day for the fever aches and pains.  Gargle with liquid of choice.  I will also give her Tussionex.  Explained that I thought this was mainly a viral upper respiratory and not strep mainly due to the fact that she is does have a cough.  She was comfortable with that.

## 2020-08-26 ENCOUNTER — Encounter: Payer: Self-pay | Admitting: Internal Medicine

## 2020-09-10 ENCOUNTER — Encounter: Payer: Self-pay | Admitting: Internal Medicine

## 2020-10-05 ENCOUNTER — Telehealth: Payer: Self-pay | Admitting: Internal Medicine

## 2020-10-05 NOTE — Telephone Encounter (Signed)
Tried to request pap smear from [facility] but they state pt never had pap done there.   TRYING TO FIND OUT WHERE and WHEN patient had this done if at all so I can update her chart with this information. Tried to call pt but vm is full

## 2020-11-12 ENCOUNTER — Telehealth: Payer: Self-pay | Admitting: Medical

## 2020-11-12 NOTE — Telephone Encounter (Signed)
Call pt regarding recent ER visit in [place]. Pt did not answer and has no voice mail

## 2021-02-08 ENCOUNTER — Encounter: Payer: Self-pay | Admitting: Internal Medicine

## 2021-09-28 ENCOUNTER — Encounter: Payer: Self-pay | Admitting: Internal Medicine
